# Patient Record
Sex: Female | Born: 2000 | Race: White | Hispanic: No | Marital: Single | Smoking: Never smoker
Health system: Southern US, Community
[De-identification: ages and names within clinical notes are randomized; demographics above are authoritative.]

## PROBLEM LIST (undated history)

## (undated) DIAGNOSIS — F419 Anxiety disorder, unspecified: Secondary | ICD-10-CM

## (undated) DIAGNOSIS — J45909 Unspecified asthma, uncomplicated: Secondary | ICD-10-CM

## (undated) DIAGNOSIS — T7840XA Allergy, unspecified, initial encounter: Secondary | ICD-10-CM

## (undated) HISTORY — DX: Allergy, unspecified, initial encounter: T78.40XA

## (undated) HISTORY — DX: Unspecified asthma, uncomplicated: J45.909

## (undated) HISTORY — DX: Anxiety disorder, unspecified: F41.9

---
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
## 2022-04-03 ENCOUNTER — Ambulatory Visit (INDEPENDENT_AMBULATORY_CARE_PROVIDER_SITE_OTHER): Payer: 59 | Admitting: Family Medicine

## 2022-04-03 ENCOUNTER — Encounter: Payer: Self-pay | Admitting: Family Medicine

## 2022-04-03 ENCOUNTER — Ambulatory Visit
Admission: RE | Admit: 2022-04-03 | Discharge: 2022-04-03 | Disposition: A | Discharge status: Home / Self Care | Payer: 59 | Source: Ambulatory Visit | Attending: Family Medicine | Admitting: Family Medicine

## 2022-04-03 ENCOUNTER — Ambulatory Visit
Admission: RE | Admit: 2022-04-03 | Discharge: 2022-04-03 | Disposition: A | Discharge status: Home / Self Care | Payer: 59 | Attending: Family Medicine | Admitting: Family Medicine

## 2022-04-03 VITALS — BP 102/70 | HR 87 | Ht 68.0 in | Wt 138.0 lb

## 2022-04-03 DIAGNOSIS — M5416 Radiculopathy, lumbar region: Secondary | ICD-10-CM | POA: Diagnosis present

## 2022-04-03 DIAGNOSIS — J45990 Exercise induced bronchospasm: Secondary | ICD-10-CM | POA: Insufficient documentation

## 2022-04-03 MED ORDER — MELOXICAM 7.5 MG PO TABS: 7.5000 mg | ORAL_TABLET | Freq: Every day | ORAL | 0 refills | Status: DC

## 2022-04-03 MED ORDER — METHOCARBAMOL 500 MG PO TABS: 500.0000 mg | ORAL_TABLET | Freq: Three times a day (TID) | ORAL | 1 refills | Status: DC | PRN

## 2022-04-03 MED ORDER — GABAPENTIN 100 MG PO CAPS: 100.0000 mg | ORAL_CAPSULE | Freq: Every day | ORAL | 0 refills | Status: DC

## 2022-04-03 NOTE — Assessment & Plan Note (Signed)
Patient presenting with greater than 1 year of atraumatic low back pain with radiation throughout the left leg below the knee.  She states that at onset she had been increasing her activity in the gym but denies any specific moment of severe pain, has had tightness, pain with prolonged sitting and prolonged standing, radiation down the left leg to below the knee, denies any saddle involvement, no contralateral involvement, no weakness, no bowel/bladder change.  She has seen a chiropractor who obtained x-rays and performed roughly 3 months of physical therapy, all without improvement and describes subjective worsening of symptoms.  Examination today is positive for straight leg raise on the left, contralateral benign, positive Faber on the left, negative FADIR on the left, piriformis stretching equivocal on the left, contralateral benign, sensorimotor intact otherwise bilateral lower extremities, nontender about the spinous processes, paraspinal region, SI joint, piriformis, greater trochanters.  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  Given her report of response to PT despite 29-month duration, will have patient focus on gentle activity as tolerated.  Pending results and response at follow-up we can determine next steps.

## 2022-04-03 NOTE — Progress Notes (Signed)
Primary Care / Sports Medicine Office Visit  Patient Information:  Patient ID: Lisa Atkins, female DOB: [birth year] Age: 21 y.o. MRN: [id number]   Lisa Atkins is a pleasant 21 y.o. female presenting with the following:  Chief Complaint  Patient presents with   Back Pain    Pain left lower 1 year, does radiate for buttocks. Feels like herniated disk. Went to pt was told to come here and get MRI. Has had xrays.     Vitals:   04/03/22 0907  BP: 102/70  Pulse: 87  SpO2: 97%   Vitals:   04/03/22 0907  Weight: 138 lb (62.6 kg)  Height: 5\' 8"  (1.727 m)   Body mass index is 20.98 kg/m.  No results found.   Independent interpretation of notes and tests performed by another provider:   None  Procedures performed:   None  Pertinent History, Exam, Impression, and Recommendations:   Problem List Items Addressed This Visit       Nervous and Auditory   Lumbar back pain with radiculopathy affecting left lower extremity - Primary    Patient presenting with greater than 1 year of atraumatic low back pain with radiation throughout the left leg below the knee.  She states that at onset she had been increasing her activity in the gym but denies any specific moment of severe pain, has had tightness, pain with prolonged sitting and prolonged standing, radiation down the left leg to below the knee, denies any saddle involvement, no contralateral involvement, no weakness, no bowel/bladder change.  She has seen a chiropractor who obtained x-rays and performed roughly 3 months of physical therapy, all without improvement and describes subjective worsening of symptoms.  Examination today is positive for straight leg raise on the left, contralateral benign, positive Faber on the left, negative FADIR on the left, piriformis stretching equivocal on the left, contralateral benign, sensorimotor intact otherwise bilateral lower extremities, nontender about the spinous processes, paraspinal  region, SI joint, piriformis, greater trochanters.  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  Given her report of response to PT despite 36-month duration, will have patient focus on gentle activity as tolerated.  Pending results and response at follow-up we can determine next steps.      Relevant Medications   meloxicam (MOBIC) 7.5 MG tablet   gabapentin (NEURONTIN) 100 MG capsule   methocarbamol (ROBAXIN) 500 MG tablet   Other Relevant Orders   DG Lumbar Spine Complete   MR Lumbar Spine Wo Contrast     Orders & Medications Meds ordered this encounter  Medications   meloxicam (MOBIC) 7.5 MG tablet    Sig: Take 1 tablet (7.5 mg total) by mouth daily.    Dispense:  45 tablet    Refill:  0   gabapentin (NEURONTIN) 100 MG capsule    Sig: Take 1 capsule (100 mg total) by mouth at bedtime.    Dispense:  45 capsule    Refill:  0   methocarbamol (ROBAXIN) 500 MG tablet    Sig: Take 1 tablet (500 mg total) by mouth every 8 (eight) hours as needed for muscle spasms.    Dispense:  30 tablet    Refill:  1  Orders Placed This Encounter  Procedures   DG Lumbar Spine Complete   MR Lumbar Spine Wo Contrast     Return in about 6 weeks (around 05/15/2022).     Jerrol Banana, MD   Primary Care Sports Medicine [facility]

## 2022-04-03 NOTE — Patient Instructions (Signed)
-   Obtain x-rays today - Start meloxicam once daily with food (no other NSAIDs while on this medication) - Start gabapentin nightly - Can take methocarbamol (muscle relaxer) every 8 hours as needed for muscle tightness pain, side effect can be drowsiness - Coordinator will contact you to schedule MRI - Perform activity as tolerated using low back/leg symptoms as a guide - Contact us for any questions - Return for follow-up in 6 weeks

## 2022-04-11 ENCOUNTER — Ambulatory Visit
Admission: RE | Admit: 2022-04-11 | Discharge: 2022-04-11 | Disposition: A | Discharge status: Home / Self Care | Payer: 59 | Source: Ambulatory Visit | Attending: Family Medicine | Admitting: Family Medicine

## 2022-04-11 DIAGNOSIS — M5416 Radiculopathy, lumbar region: Secondary | ICD-10-CM | POA: Insufficient documentation

## 2022-04-13 ENCOUNTER — Other Ambulatory Visit: Payer: Self-pay

## 2022-04-13 ENCOUNTER — Encounter: Payer: Self-pay | Admitting: Family Medicine

## 2022-04-13 DIAGNOSIS — M5416 Radiculopathy, lumbar region: Secondary | ICD-10-CM

## 2022-04-13 NOTE — Telephone Encounter (Signed)
Referral place 

## 2022-04-13 NOTE — Telephone Encounter (Signed)
Please advise 

## 2022-05-03 ENCOUNTER — Encounter: Payer: Self-pay | Admitting: Pain Medicine

## 2022-05-15 ENCOUNTER — Ambulatory Visit: Payer: 59 | Admitting: Family Medicine

## 2022-05-15 ENCOUNTER — Ambulatory Visit: Payer: Self-pay | Admitting: Family Medicine

## 2022-06-10 ENCOUNTER — Encounter: Payer: Self-pay | Admitting: Pain Medicine

## 2022-06-10 DIAGNOSIS — Z789 Other specified health status: Secondary | ICD-10-CM | POA: Insufficient documentation

## 2022-06-10 DIAGNOSIS — M899 Disorder of bone, unspecified: Secondary | ICD-10-CM | POA: Insufficient documentation

## 2022-06-10 DIAGNOSIS — G894 Chronic pain syndrome: Secondary | ICD-10-CM | POA: Insufficient documentation

## 2022-06-10 DIAGNOSIS — Z79899 Other long term (current) drug therapy: Secondary | ICD-10-CM

## 2022-06-10 DIAGNOSIS — R937 Abnormal findings on diagnostic imaging of other parts of musculoskeletal system: Secondary | ICD-10-CM | POA: Insufficient documentation

## 2022-06-10 HISTORY — DX: Other long term (current) drug therapy: Z79.899

## 2022-06-10 NOTE — Progress Notes (Unsigned)
Patient: Lisa Atkins  Service Category: E/M  Provider: Gaspar Cola, MD  DOB: Aug 20, 2000  DOS: 06/11/2022  Referring Provider: Montel Culver, MD  MRN: [id number]  Setting: Ambulatory outpatient  PCP: Tresea Mall, MD  Type: New Patient  Specialty: Interventional Pain Management    Location: Office  Delivery: Face-to-face     Primary Reason(s) for Visit: Encounter for initial evaluation of one or more chronic problems (new to examiner) potentially causing chronic pain, and posing a threat to normal musculoskeletal function. (Level of risk: High) CC: No chief complaint on file.  HPI  Lisa Atkins is a 22 y.o. year old, female patient, who comes for the first time to our practice referred by Montel Culver, MD for our initial evaluation of her chronic pain. She has Exercise-induced asthma; Lumbar back pain with radiculopathy affecting left lower extremity; Chronic pain syndrome; Pharmacologic therapy; Disorder of skeletal system; Problems influencing health status; and Abnormal MRI, lumbar spine (04/11/2022) on their problem list. Today she comes in for evaluation of her No chief complaint on file.  Pain Assessment: Location:     Radiating:   Onset:   Duration:   Quality:   Severity:  /10 (subjective, self-reported pain score)  Effect on ADL:   Timing:   Modifying factors:   BP:    HR:    Onset and Duration: {Hx; Onset and Duration:210120511} Cause of pain: {Hx; Cause:210120521} Severity: {Pain Severity:210120502} Timing: {Symptoms; Timing:210120501} Aggravating Factors: {Causes; Aggravating pain factors:210120507} Alleviating Factors: {Causes; Alleviating Factors:210120500} Associated Problems: {Hx; Associated problems:210120515} Quality of Pain: {Hx; Symptom quality or Descriptor:210120531} Previous Examinations or Tests: {Hx; Previous examinations or test:210120529} Previous Treatments: {Hx; Previous Treatment:210120503}  Lisa Atkins is being evaluated for possible  interventional pain management therapies for the treatment of her chronic pain.   ***  Lisa Atkins has been informed that this initial visit was an evaluation only.  On the follow up appointment I will go over the results, including ordered tests and available interventional therapies. At that time she will have the opportunity to decide whether to proceed with offered therapies or not. In the event that Ms. Clinkenbeard prefers avoiding interventional options, this will conclude our involvement in the case.  Medication management recommendations may be provided upon request.  Historic Controlled Substance Pharmacotherapy Review  PMP and historical list of controlled substances: ***  Most recently prescribed opioid analgesics:   *** MME/day: *** mg/day  Historical Monitoring: The patient  reports that she does not currently use drugs. List of prior UDS Testing: No results found for: "MDMA", "COCAINSCRNUR", "PCPSCRNUR", "PCPQUANT", "CANNABQUANT", "THCU", "ETH", "CBDTHCR", "D8THCCBX", "D9THCCBX" Historical Background Evaluation: [place] PMP: PDMP reviewed during this encounter. Review of the past 87-months conducted.             PMP NARX Score Report:  Narcotic: 000 Sedative: 000 Stimulant: 000 [place] Department of public safety, offender search: Editor, commissioning Information) Non-contributory Risk Assessment Profile: Aberrant behavior: None observed or detected today Risk factors for fatal opioid overdose: None identified today PMP NARX Overdose Risk Score: 000 Fatal overdose hazard ratio (HR): Calculation deferred Non-fatal overdose hazard ratio (HR): Calculation deferred Risk of opioid abuse or dependence: 0.7-3.0% with doses ? 36 MME/day and 6.1-26% with doses ? 120 MME/day. Substance use disorder (SUD) risk level: See below Personal History of Substance Abuse (SUD-Substance use disorder):  Alcohol:    Illegal Drugs:    Rx Drugs:    ORT Risk Level calculation:    ORT Scoring interpretation table:  Score <3 = Low Risk for SUD  Score between 4-7 = Moderate Risk for SUD  Score >8 = High Risk for Opioid Abuse   PHQ-2 Depression Scale:  Total score:    PHQ-2 Scoring interpretation table: (Score and probability of major depressive disorder)  Score 0 = No depression  Score 1 = 15.4% Probability  Score 2 = 21.1% Probability  Score 3 = 38.4% Probability  Score 4 = 45.5% Probability  Score 5 = 56.4% Probability  Score 6 = 78.6% Probability   PHQ-9 Depression Scale:  Total score:    PHQ-9 Scoring interpretation table:  Score 0-4 = No depression  Score 5-9 = Mild depression  Score 10-14 = Moderate depression  Score 15-19 = Moderately severe depression  Score 20-27 = Severe depression (2.4 times higher risk of SUD and 2.89 times higher risk of overuse)   Pharmacologic Plan: As per protocol, I have not taken over any controlled substance management, pending the results of ordered tests and/or consults.            Initial impression: Pending review of available data and ordered tests.  Meds   Current Outpatient Medications:    FLOVENT HFA 44 MCG/ACT inhaler, SMARTSIG:2 Puff(s) By Mouth Twice Daily, Disp: , Rfl:    gabapentin (NEURONTIN) 100 MG capsule, Take 1 capsule (100 mg total) by mouth at bedtime., Disp: 45 capsule, Rfl: 0   meloxicam (MOBIC) 7.5 MG tablet, Take 1 tablet (7.5 mg total) by mouth daily., Disp: 45 tablet, Rfl: 0   methocarbamol (ROBAXIN) 500 MG tablet, Take 1 tablet (500 mg total) by mouth every 8 (eight) hours as needed for muscle spasms., Disp: 30 tablet, Rfl: 1  Imaging Review  Lumbosacral Imaging: Lumbar MR wo contrast: Results for orders placed during the hospital encounter of 04/11/22 MR Lumbar Spine Wo Contrast  Narrative CLINICAL DATA:  Lumbar radiculopathy. Symptoms greater than 6 weeks. Lower back pain  EXAM: MRI LUMBAR SPINE WITHOUT CONTRAST  TECHNIQUE: Multiplanar, multisequence MR imaging of the lumbar spine was performed. No intravenous  contrast was administered.  COMPARISON:  None Available.  FINDINGS: Segmentation:   5 non rib-bearing lumbar type vertebral bodies are present. The lowest fully formed vertebral body is L5.  Alignment:  Physiologic.  Vertebrae:  No fracture, evidence of discitis, or bone lesion.  Conus medullaris and cauda equina: Conus extends to the L1 level. Conus and cauda equina appear normal.  Paraspinal and other soft tissues: Negative.  Disc levels:  T12-L1: No significant disc bulge. No neural foraminal stenosis. No central canal stenosis.  L1-L2: No significant disc bulge. No neural foraminal stenosis. No central canal stenosis.  L2-L3: No significant disc bulge. No neural foraminal stenosis. No central canal stenosis.  L3-L4: No significant disc bulge. No neural foraminal stenosis. No central canal stenosis.  L4-L5: No significant disc bulge. No neural foraminal stenosis. No central canal stenosis.  L5-S1: Eccentric disc extrusion to the left with marked narrowing of the left lateral recess and impingement of the descending nerve roots. Mild left neural foraminal stenosis. Mild bilateral facet joint arthropathy.  IMPRESSION: Eccentric disc extrusion to the left at L5-S1 with marked narrowing of the left lateral recess and impingement of the descending nerve roots. Mild left neural foraminal stenosis.   Electronically Signed By: Keane Police D.O. On: 04/11/2022 17:00  Lumbar DG (Complete) 4+V: Results for orders placed during the hospital encounter of 04/03/22 DG Lumbar Spine Complete  Narrative CLINICAL DATA:  Low back pain with left radicular  symptoms  EXAM: LUMBAR SPINE - COMPLETE 4+ VIEW  COMPARISON:  None Available.  FINDINGS: There is no evidence of lumbar spine fracture. Alignment is normal. Intervertebral disc spaces are maintained.  IMPRESSION: Negative.   Electronically Signed By: Judie Petit.  Shick M.D. On: 04/03/2022 13:46  Elbow Imaging: Elbow-L  DG Complete: Results for orders placed during the hospital encounter of 10/07/15 DG Elbow Complete Left  Narrative CLINICAL DATA:  Pain after fall.  EXAM: LEFT ELBOW - COMPLETE 3+ VIEW  COMPARISON:  None.  FINDINGS: The anterior fat pad is slightly prominent but no posterior fat pad is identified. No fractures are identified.  IMPRESSION: No fractures identified. The anterior fat pad is slightly prominent but there is no posterior fat pad identified. No convincing evidence of effusion.   Electronically Signed By: Gerome Sam III M.D On: 10/07/2015 14:35  Complexity Note: Imaging results reviewed.                         ROS  Cardiovascular: {Hx; Cardiovascular History:210120525} Pulmonary or Respiratory: {Hx; Pumonary and/or Respiratory History:210120523} Neurological: {Hx; Neurological:210120504} Psychological-Psychiatric: {Hx; Psychological-Psychiatric History:210120512} Gastrointestinal: {Hx; Gastrointestinal:210120527} Genitourinary: {Hx; Genitourinary:210120506} Hematological: {Hx; Hematological:210120510} Endocrine: {Hx; Endocrine history:210120509} Rheumatologic: {Hx; Rheumatological:210120530} Musculoskeletal: {Hx; Musculoskeletal:210120528} Work History: {Hx; Work history:210120514}  Allergies  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  Laboratory Chemistry Profile   Renal No results found for: "BUN", "CREATININE", "LABCREA", "BCR", "GFR", "GFRAA", "GFRNONAA", "SPECGRAV", "PHUR", "PROTEINUR"   Electrolytes No results found for: "NA", "K", "CL", "CALCIUM", "MG", "PHOS"   Hepatic No results found for: "AST", "ALT", "ALBUMIN", "ALKPHOS", "AMYLASE", "LIPASE", "AMMONIA"   ID No results found for: "LYMEIGGIGMAB", "HIV", "SARSCOV2NAA", "STAPHAUREUS", "MRSAPCR", "HCVAB", "PREGTESTUR", "RMSFIGG", "QFVRPH1IGG", "QFVRPH2IGG"   Bone No results found for: "VD25OH", "VD125OH2TOT", "KG4010UV2",  "ZD6644IH4", "25OHVITD1", "25OHVITD2", "25OHVITD3", "TESTOFREE", "TESTOSTERONE"   Endocrine No results found for: "GLUCOSE", "GLUCOSEU", "HGBA1C", "TSH", "FREET4", "TESTOFREE", "TESTOSTERONE", "SHBG", "ESTRADIOL", "ESTRADIOLPCT", "ESTRADIOLFRE", "LABPREG", "ACTH", "CRTSLPL", "UCORFRPERLTR", "UCORFRPERDAY", "CORTISOLBASE"   Neuropathy No results found for: "VITAMINB12", "FOLATE", "HGBA1C", "HIV"   CNS No results found for: "COLORCSF", "APPEARCSF", "RBCCOUNTCSF", "WBCCSF", "POLYSCSF", "LYMPHSCSF", "EOSCSF", "PROTEINCSF", "GLUCCSF", "JCVIRUS", "CSFOLI", "IGGCSF", "LABACHR", "ACETBL"   Inflammation (CRP: Acute  ESR: Chronic) No results found for: "CRP", "ESRSEDRATE", "LATICACIDVEN"   Rheumatology No results found for: "RF", "ANA", "LABURIC", "URICUR", "LYMEIGGIGMAB", "LYMEABIGMQN", "HLAB27"   Coagulation No results found for: "INR", "LABPROT", "APTT", "PLT", "DDIMER", "LABHEMA", "VITAMINK1", "AT3"   Cardiovascular No results found for: "BNP", "CKTOTAL", "CKMB", "TROPONINI", "HGB", "HCT", "LABVMA", "EPIRU", "EPINEPH24HUR", "NOREPRU", "NOREPI24HUR", "DOPARU", "DOPAM24HRUR"   Screening No results found for: "SARSCOV2NAA", "COVIDSOURCE", "STAPHAUREUS", "MRSAPCR", "HCVAB", "HIV", "PREGTESTUR"   Cancer No results found for: "CEA", "CA125", "LABCA2"   Allergens No results found for: "ALMOND", "APPLE", "ASPARAGUS", "AVOCADO", "BANANA", "BARLEY", "BASIL", "BAYLEAF", "GREENBEAN", "LIMABEAN", "WHITEBEAN", "BEEFIGE", "REDBEET", "BLUEBERRY", "BROCCOLI", "CABBAGE", "MELON", "CARROT", "CASEIN", "CASHEWNUT", "CAULIFLOWER", "CELERY"     Note: Lab results reviewed.  PFSH  Drug: Ms. Fossett  reports that she does not currently use drugs. Alcohol:  reports no history of alcohol use. Tobacco:  reports that she has never smoked. She has never used smokeless tobacco. Medical:  has a past medical history of Allergy, Anxiety, Asthma, and Pharmacologic therapy (06/10/2022). Family: family history is not on  file.  No past surgical history on file. Active Ambulatory Problems    Diagnosis Date Noted   Exercise-induced asthma 04/03/2022   Lumbar back pain with radiculopathy affecting left lower extremity 04/03/2022   Chronic pain syndrome  06/10/2022   Pharmacologic therapy 06/10/2022   Disorder of skeletal system 06/10/2022   Problems influencing health status 06/10/2022   Abnormal MRI, lumbar spine (04/11/2022) 06/10/2022   Resolved Ambulatory Problems    Diagnosis Date Noted   No Resolved Ambulatory Problems   Past Medical History:  Diagnosis Date   Allergy    Anxiety    Asthma    Constitutional Exam  General appearance: Well nourished, well developed, and well hydrated. In no apparent acute distress There were no vitals filed for this visit. BMI Assessment: Estimated body mass index is 20.98 kg/m as calculated from the following:   Height as of 04/03/22: 5\' 8"  (1.727 m).   Weight as of 04/03/22: 138 lb (62.6 kg).  BMI interpretation table: BMI level Category Range association with higher incidence of chronic pain  <18 kg/m2 Underweight   18.5-24.9 kg/m2 Ideal body weight   25-29.9 kg/m2 Overweight Increased incidence by 20%  30-34.9 kg/m2 Obese (Class I) Increased incidence by 68%  35-39.9 kg/m2 Severe obesity (Class II) Increased incidence by 136%  >40 kg/m2 Extreme obesity (Class III) Increased incidence by 254%   Patient's current BMI Ideal Body weight  There is no height or weight on file to calculate BMI. Patient weight not recorded   BMI Readings from Last 4 Encounters:  04/03/22 20.98 kg/m (42 %, Z= -0.20)*   * Growth percentiles are based on CDC (Girls, 2-20 Years) data.   Wt Readings from Last 4 Encounters:  04/03/22 138 lb (62.6 kg) (68 %, Z= 0.47)*   * Growth percentiles are based on CDC (Girls, 2-20 Years) data.    Psych/Mental status: Alert, oriented x 3 (person, place, & time)       Eyes: PERLA Respiratory: No evidence of acute respiratory  distress  Assessment  Primary Diagnosis & Pertinent Problem List: The primary encounter diagnosis was Chronic pain syndrome. Diagnoses of Pharmacologic therapy, Disorder of skeletal system, and Problems influencing health status were also pertinent to this visit.  Visit Diagnosis (New problems to examiner): 1. Chronic pain syndrome   2. Pharmacologic therapy   3. Disorder of skeletal system   4. Problems influencing health status    Plan of Care (Initial workup plan)  Note: Ms. Peragine was reminded that as per protocol, today's visit has been an evaluation only. We have not taken over the patient's controlled substance management.  Problem-specific plan: No problem-specific Assessment & Plan notes found for this encounter.  Lab Orders  No laboratory test(s) ordered today   Imaging Orders  No imaging studies ordered today   Referral Orders  No referral(s) requested today   Procedure Orders    No procedure(s) ordered today   Pharmacotherapy (current): Medications ordered:  No orders of the defined types were placed in this encounter.  Medications administered during this visit: Tavonna D. Quentin Ore had no medications administered during this visit.   Analgesic Pharmacotherapy:  Opioid Analgesics: For patients currently taking or requesting to take opioid analgesics, in accordance with [place], we will assess their risks and indications for the use of these substances. After completing our evaluation, we may offer recommendations, but we no longer take patients for medication management. The prescribing physician will ultimately decide, based on his/her training and level of comfort whether to adopt any of the recommendations, including whether or not to prescribe such medicines.  Membrane stabilizer: To be determined at a later time  Muscle relaxant: To be determined at a later time  NSAID:  To be determined at a later time  Other analgesic(s): To be  determined at a later time   Interventional management options: Ms. Steinert was informed that there is no guarantee that she would be a candidate for interventional therapies. The decision will be based on the results of diagnostic studies, as well as Ms. Herren's risk profile.  Procedure(s) under consideration:  Pending results of ordered studies      Interventional Therapies  Risk Factors  Considerations:     Planned  Pending:   See above for possible orders   Under consideration:   Pending completion of evaluation   Completed:   None at this time   Completed by other providers:   None at this time   Therapeutic  Palliative (PRN) options:   None established      Provider-requested follow-up: No follow-ups on file.  Future Appointments  Date Time Provider [place]  06/11/2022  1:00 PM Milinda Pointer, MD [facility] None    Duration of encounter: *** minutes.  Total time on encounter, as per AMA guidelines included both the face-to-face and non-face-to-face time personally spent by the physician and/or other qualified health care professional(s) on the day of the encounter (includes time in activities that require the physician or other qualified health care professional and does not include time in activities normally performed by clinical staff). Physician's time may include the following activities when performed: Preparing to see the patient (e.g., pre-charting review of records, searching for previously ordered imaging, lab work, and nerve conduction tests) Review of prior analgesic pharmacotherapies. Reviewing PMP Interpreting ordered tests (e.g., lab work, imaging, nerve conduction tests) Performing post-procedure evaluations, including interpretation of diagnostic procedures Obtaining and/or reviewing separately obtained history Performing a medically appropriate examination and/or evaluation Counseling and educating the  patient/family/caregiver Ordering medications, tests, or procedures Referring and communicating with other health care professionals (when not separately reported) Documenting clinical information in the electronic or other health record Independently interpreting results (not separately reported) and communicating results to the patient/ family/caregiver Care coordination (not separately reported)  Note by: Gaspar Cola, MD Date: 06/11/2022; Time: 11:06 AM

## 2022-06-11 ENCOUNTER — Encounter: Payer: Self-pay | Admitting: Pain Medicine

## 2022-06-11 ENCOUNTER — Ambulatory Visit: Payer: 59 | Attending: Pain Medicine | Admitting: Pain Medicine

## 2022-06-11 VITALS — BP 118/79 | HR 82 | Temp 97.2°F | Resp 16 | Ht 68.0 in | Wt 129.0 lb

## 2022-06-11 DIAGNOSIS — M4807 Spinal stenosis, lumbosacral region: Secondary | ICD-10-CM | POA: Insufficient documentation

## 2022-06-11 DIAGNOSIS — M79605 Pain in left leg: Secondary | ICD-10-CM | POA: Diagnosis present

## 2022-06-11 DIAGNOSIS — M5416 Radiculopathy, lumbar region: Secondary | ICD-10-CM | POA: Diagnosis present

## 2022-06-11 DIAGNOSIS — R937 Abnormal findings on diagnostic imaging of other parts of musculoskeletal system: Secondary | ICD-10-CM | POA: Diagnosis present

## 2022-06-11 DIAGNOSIS — M5127 Other intervertebral disc displacement, lumbosacral region: Secondary | ICD-10-CM | POA: Insufficient documentation

## 2022-06-11 DIAGNOSIS — M899 Disorder of bone, unspecified: Secondary | ICD-10-CM

## 2022-06-11 DIAGNOSIS — M5410 Radiculopathy, site unspecified: Secondary | ICD-10-CM | POA: Insufficient documentation

## 2022-06-11 DIAGNOSIS — M5442 Lumbago with sciatica, left side: Secondary | ICD-10-CM | POA: Diagnosis present

## 2022-06-11 DIAGNOSIS — M5417 Radiculopathy, lumbosacral region: Secondary | ICD-10-CM | POA: Insufficient documentation

## 2022-06-11 DIAGNOSIS — Z789 Other specified health status: Secondary | ICD-10-CM

## 2022-06-11 DIAGNOSIS — Z79899 Other long term (current) drug therapy: Secondary | ICD-10-CM

## 2022-06-11 DIAGNOSIS — G8929 Other chronic pain: Secondary | ICD-10-CM | POA: Insufficient documentation

## 2022-06-11 DIAGNOSIS — G894 Chronic pain syndrome: Secondary | ICD-10-CM | POA: Diagnosis present

## 2022-06-11 MED ORDER — PREDNISONE 20 MG PO TABS: ORAL_TABLET | ORAL | 0 refills | Status: AC

## 2022-06-11 NOTE — Patient Instructions (Signed)
______________________________________________________________________  Procedure instructions  Do not eat or drink fluids (other than water) for 6 hours before your procedure  No water for 2 hours before your procedure  Take your blood pressure medicine with a sip of water  Arrive 30 minutes before your appointment  Carefully read the "Preparing for your procedure" detailed instructions  If you have questions call us at [phone]  _____________________________________________________________________    ______________________________________________________________________  Preparing for your procedure  During your procedure appointment there will be: No Prescription Refills. No disability issues to discussed. No medication changes or discussions.  Instructions: Food intake: Avoid eating anything solid for at least 8 hours prior to your procedure. Clear liquid intake: You may take clear liquids such as water up to 2 hours prior to your procedure. (No carbonated drinks. No soda.) Transportation: Unless otherwise stated by your physician, bring a driver. Morning Medicines: Except for blood thinners, take all of your other morning medications with a sip of water. Make sure to take your heart and blood pressure medicines. If your blood pressure's lower number is above 100, the case will be rescheduled. Blood thinners: Make sure to stop your blood thinners as instructed.  If you take a blood thinner, but were not instructed to stop it, call our office [phone] and ask to talk to a nurse. Not stopping a blood thinner prior to certain procedures could lead to serious complications. Diabetics on insulin: Notify the staff so that you can be scheduled 1st case in the morning. If your diabetes requires high dose insulin, take only  of your normal insulin dose the morning of the procedure and notify the staff that you have done so. Preventing infections: Shower with an  antibacterial soap the morning of your procedure.  Build-up your immune system: Take 1000 mg of Vitamin C with every meal (3 times a day) the day prior to your procedure. Antibiotics: Inform the nursing staff if you are taking any antibiotics or if you have any conditions that may require antibiotics prior to procedures. (Example: recent joint implants)   Pregnancy: If you are pregnant make sure to notify the nursing staff. Not doing so may result in injury to the fetus, including death.  Sickness: If you have a cold, fever, or any active infections, call and cancel or reschedule your procedure. Receiving steroids while having an infection may result in complications. Arrival: You must be in the facility at least 30 minutes prior to your scheduled procedure. Tardiness: Your scheduled time is also the cutoff time. If you do not arrive at least 15 minutes prior to your procedure, you will be rescheduled.  Children: Do not bring any children with you. Make arrangements to keep them home. Dress appropriately: There is always a possibility that your clothing may get soiled. Avoid long dresses. Valuables: Do not bring any jewelry or valuables.  Reasons to call and reschedule or cancel your procedure: (Following these recommendations will minimize the risk of a serious complication.) Surgeries: Avoid having procedures within 2 weeks of any surgery. (Avoid for 2 weeks before or after any surgery). Flu Shots: Avoid having procedures within 2 weeks of a flu shots or . (Avoid for 2 weeks before or after immunizations). Barium: Avoid having a procedure within 7-10 days after having had a radiological study involving the use of radiological contrast. (Myelograms, Barium swallow or enema study). Heart attacks: Avoid any elective procedures or surgeries for the initial 6 months after a "Myocardial Infarction" (Heart Attack). Blood thinners: It  is imperative that you stop these medications before procedures. Let us  know if you if you take any blood thinner.  Infection: Avoid procedures during or within two weeks of an infection (including chest colds or gastrointestinal problems). Symptoms associated with infections include: Localized redness, fever, chills, night sweats or profuse sweating, burning sensation when voiding, cough, congestion, stuffiness, runny nose, sore throat, diarrhea, nausea, vomiting, cold or Flu symptoms, recent or current infections. It is specially important if the infection is over the area that we intend to treat. Heart and lung problems: Symptoms that may suggest an active cardiopulmonary problem include: cough, chest pain, breathing difficulties or shortness of breath, dizziness, ankle swelling, uncontrolled high or unusually low blood pressure, and/or palpitations. If you are experiencing any of these symptoms, cancel your procedure and contact your primary care physician for an evaluation.  Remember:  Regular Business hours are:  Monday to Thursday 8:00 AM to 4:00 PM  Provider's Schedule: Milinda Pointer, MD:  Procedure days: Tuesday and Thursday 7:30 AM to 4:00 PM  Gillis Santa, MD:  Procedure days: Monday and Wednesday 7:30 AM to 4:00 PM  ______________________________________________________________________    ____________________________________________________________________________________________  General Risks and Possible Complications  Patient Responsibilities: It is important that you read this as it is part of your informed consent. It is our duty to inform you of the risks and possible complications associated with treatments offered to you. It is your responsibility as a patient to read this and to ask questions about anything that is not clear or that you believe was not covered in this document.  Patient's Rights: You have the right to refuse treatment. You also have the right to change your mind, even after initially having agreed to have the treatment  done. However, under this last option, if you wait until the last second to change your mind, you may be charged for the materials used up to that point.  Introduction: Medicine is not an Chief Strategy Officer. Everything in Medicine, including the lack of treatment(s), carries the potential for danger, harm, or loss (which is by definition: Risk). In Medicine, a complication is a secondary problem, condition, or disease that can aggravate an already existing one. All treatments carry the risk of possible complications. The fact that a side effects or complications occurs, does not imply that the treatment was conducted incorrectly. It must be clearly understood that these can happen even when everything is done following the highest safety standards.  No treatment: You can choose not to proceed with the proposed treatment alternative. The "PRO(s)" would include: avoiding the risk of complications associated with the therapy. The "CON(s)" would include: not getting any of the treatment benefits. These benefits fall under one of three categories: diagnostic; therapeutic; and/or palliative. Diagnostic benefits include: getting information which can ultimately lead to improvement of the disease or symptom(s). Therapeutic benefits are those associated with the successful treatment of the disease. Finally, palliative benefits are those related to the decrease of the primary symptoms, without necessarily curing the condition (example: decreasing the pain from a flare-up of a chronic condition, such as incurable terminal cancer).  General Risks and Complications: These are associated to most interventional treatments. They can occur alone, or in combination. They fall under one of the following six (6) categories: no benefit or worsening of symptoms; bleeding; infection; nerve damage; allergic reactions; and/or death. No benefits or worsening of symptoms: In Medicine there are no guarantees, only probabilities. No  healthcare provider can ever guarantee that a  medical treatment will work, they can only state the probability that it may. Furthermore, there is always the possibility that the condition may worsen, either directly, or indirectly, as a consequence of the treatment. Bleeding: This is more common if the patient is taking a blood thinner, either prescription or over the counter (example: Goody Powders, Fish oil, Aspirin, Garlic, etc.), or if suffering a condition associated with impaired coagulation (example: Hemophilia, cirrhosis of the liver, low platelet counts, etc.). However, even if you do not have one on these, it can still happen. If you have any of these conditions, or take one of these drugs, make sure to notify your treating physician. Infection: This is more common in patients with a compromised immune system, either due to disease (example: diabetes, cancer, human immunodeficiency virus [HIV], etc.), or due to medications or treatments (example: therapies used to treat cancer and rheumatological diseases). However, even if you do not have one on these, it can still happen. If you have any of these conditions, or take one of these drugs, make sure to notify your treating physician. Nerve Damage: This is more common when the treatment is an invasive one, but it can also happen with the use of medications, such as those used in the treatment of cancer. The damage can occur to small secondary nerves, or to large primary ones, such as those in the spinal cord and brain. This damage may be temporary or permanent and it may lead to impairments that can range from temporary numbness to permanent paralysis and/or brain death. Allergic Reactions: Any time a substance or material comes in contact with our body, there is the possibility of an allergic reaction. These can range from a mild skin rash (contact dermatitis) to a severe systemic reaction (anaphylactic reaction), which can result in death. Death: In  general, any medical intervention can result in death, most of the time due to an unforeseen complication. ____________________________________________________________________________________________    ____________________________________________________________________________________________  Blood Thinners  IMPORTANT NOTICE:  If you take any of these, make sure to notify the nursing staff.  Failure to do so may result in injury.  Recommended time intervals to stop and restart blood-thinners, before & after invasive procedures  Generic Name Brand Name Pre-procedure. Stop this long before procedure. Post-procedure. Minimum waiting period before restarting.  Abciximab Reopro 15 days 2 hrs  Alteplase Activase 10 days 10 days  Anagrelide Agrylin    Apixaban Eliquis 3 days 6 hrs  Cilostazol Pletal 3 days 5 hrs  Clopidogrel Plavix 7-10 days 2 hrs  Dabigatran Pradaxa 5 days 6 hrs  Dalteparin Fragmin 24 hours 4 hrs  Dipyridamole Aggrenox 11days 2 hrs  Edoxaban Lixiana; Savaysa 3 days 2 hrs  Enoxaparin  Lovenox 24 hours 4 hrs  Eptifibatide Integrillin 8 hours 2 hrs  Fondaparinux  Arixtra 72 hours 12 hrs  Hydroxychloroquine Plaquenil 11 days   Prasugrel Effient 7-10 days 6 hrs  Reteplase Retavase 10 days 10 days  Rivaroxaban Xarelto 3 days 6 hrs  Ticagrelor Brilinta 5-7 days 6 hrs  Ticlopidine Ticlid 10-14 days 2 hrs  Tinzaparin Innohep 24 hours 4 hrs  Tirofiban Aggrastat 8 hours 2 hrs  Warfarin Coumadin 5 days 2 hrs   Other medications with blood-thinning effects  Product indications Generic (Brand) names Note  Cholesterol Lipitor Stop 4 days before procedure  Blood thinner (injectable) Heparin (LMW or LMWH Heparin) Stop 24 hours before procedure  Cancer Ibrutinib (Imbruvica) Stop 7 days before procedure  Malaria/Rheumatoid Hydroxychloroquine (Plaquenil) Stop 11 days  before procedure  Thrombolytics  10 days before or after procedures   Over-the-counter (OTC) Products with  blood-thinning effects  Product Common names Stop Time  Aspirin > 325 mg Goody Powders, Excedrin, etc. 11 days  Aspirin ? 81 mg  7 days  Fish oil  4 days  Garlic supplements  7 days  Ginkgo biloba  36 hours  Ginseng  24 hours  NSAIDs Ibuprofen, Naprosyn, etc. 3 days  Vitamin E  4 days   ____________________________________________________________________________________________

## 2022-06-11 NOTE — Progress Notes (Signed)
Safety precautions to be maintained throughout the outpatient stay will include: orient to surroundings, keep bed in low position, maintain call bell within reach at all times, provide assistance with transfer out of bed and ambulation.  

## 2022-07-04 ENCOUNTER — Other Ambulatory Visit: Payer: Self-pay | Admitting: Pain Medicine

## 2022-07-04 ENCOUNTER — Telehealth: Payer: Self-pay

## 2022-07-04 DIAGNOSIS — M5127 Other intervertebral disc displacement, lumbosacral region: Secondary | ICD-10-CM

## 2022-07-04 DIAGNOSIS — M5416 Radiculopathy, lumbar region: Secondary | ICD-10-CM

## 2022-07-04 DIAGNOSIS — R937 Abnormal findings on diagnostic imaging of other parts of musculoskeletal system: Secondary | ICD-10-CM

## 2022-07-04 DIAGNOSIS — M4807 Spinal stenosis, lumbosacral region: Secondary | ICD-10-CM

## 2022-07-04 NOTE — Telephone Encounter (Signed)
Pre-procedure instructions given to patient.

## 2022-07-04 NOTE — Telephone Encounter (Signed)
March 7 at  1040 Patient is aware and expecting your call.

## 2022-07-04 NOTE — Telephone Encounter (Signed)
She called in to say the steroid taper didn't help. She wants to proceed with the [facility] DR. Consuela Mimes has a PRN order. Does she need an eval or can I schedule Lesi? His note says if taper doesn't work we will bring her in for [facility].

## 2022-07-12 ENCOUNTER — Encounter: Payer: Self-pay | Admitting: Pain Medicine

## 2022-07-12 ENCOUNTER — Ambulatory Visit
Admission: RE | Admit: 2022-07-12 | Discharge: 2022-07-12 | Disposition: A | Discharge status: Home / Self Care | Payer: 59 | Source: Ambulatory Visit | Attending: Pain Medicine | Admitting: Pain Medicine

## 2022-07-12 ENCOUNTER — Ambulatory Visit: Payer: 59 | Attending: Pain Medicine | Admitting: Pain Medicine

## 2022-07-12 VITALS — BP 129/83 | HR 79 | Temp 79.5°F | Resp 19 | Ht 68.0 in | Wt 126.0 lb

## 2022-07-12 DIAGNOSIS — M5417 Radiculopathy, lumbosacral region: Secondary | ICD-10-CM

## 2022-07-12 DIAGNOSIS — M5442 Lumbago with sciatica, left side: Secondary | ICD-10-CM | POA: Diagnosis not present

## 2022-07-12 DIAGNOSIS — M4807 Spinal stenosis, lumbosacral region: Secondary | ICD-10-CM | POA: Diagnosis not present

## 2022-07-12 DIAGNOSIS — R937 Abnormal findings on diagnostic imaging of other parts of musculoskeletal system: Secondary | ICD-10-CM | POA: Diagnosis not present

## 2022-07-12 DIAGNOSIS — M5416 Radiculopathy, lumbar region: Secondary | ICD-10-CM | POA: Diagnosis not present

## 2022-07-12 DIAGNOSIS — M79605 Pain in left leg: Secondary | ICD-10-CM | POA: Insufficient documentation

## 2022-07-12 DIAGNOSIS — M5127 Other intervertebral disc displacement, lumbosacral region: Secondary | ICD-10-CM | POA: Insufficient documentation

## 2022-07-12 DIAGNOSIS — G8929 Other chronic pain: Secondary | ICD-10-CM | POA: Diagnosis present

## 2022-07-12 MED ORDER — IOHEXOL 180 MG/ML  SOLN
10.0000 mL | Freq: Once | INTRAMUSCULAR | Status: AC
  Administered 2022-07-12: 10 mL via EPIDURAL
  Filled 2022-07-12: qty 20

## 2022-07-12 MED ORDER — LIDOCAINE HCL 2 % IJ SOLN
20.0000 mL | Freq: Once | INTRAMUSCULAR | Status: AC
  Administered 2022-07-12: 400 mg
  Filled 2022-07-12: qty 20

## 2022-07-12 MED ORDER — SODIUM CHLORIDE 0.9% FLUSH
2.0000 mL | Freq: Once | INTRAVENOUS | Status: AC
  Administered 2022-07-12: 10 mL

## 2022-07-12 MED ORDER — PENTAFLUOROPROP-TETRAFLUOROETH EX AERO
INHALATION_SPRAY | Freq: Once | CUTANEOUS | Status: AC
  Administered 2022-07-12: 30 via TOPICAL

## 2022-07-12 MED ORDER — SODIUM CHLORIDE (PF) 0.9 % IJ SOLN
INTRAMUSCULAR | Status: AC
  Filled 2022-07-12: qty 10

## 2022-07-12 MED ORDER — MIDAZOLAM HCL 2 MG/2ML IJ SOLN: 0.5000 mg | Freq: Once | INTRAMUSCULAR | Status: DC

## 2022-07-12 MED ORDER — TRIAMCINOLONE ACETONIDE 40 MG/ML IJ SUSP
40.0000 mg | Freq: Once | INTRAMUSCULAR | Status: AC
  Administered 2022-07-12: 40 mg
  Filled 2022-07-12: qty 1

## 2022-07-12 MED ORDER — LACTATED RINGERS IV SOLN: Freq: Once | INTRAVENOUS | Status: DC

## 2022-07-12 MED ORDER — ROPIVACAINE HCL 2 MG/ML IJ SOLN
2.0000 mL | Freq: Once | INTRAMUSCULAR | Status: AC
  Administered 2022-07-12: 20 mL via EPIDURAL
  Filled 2022-07-12: qty 20

## 2022-07-12 NOTE — Progress Notes (Signed)
PROVIDER NOTE: Interpretation of information contained herein should be left to medically-trained personnel. Specific patient instructions are provided elsewhere under "Patient Instructions" section of medical record. This document was created in part using STT-dictation technology, any transcriptional errors that may result from this process are unintentional.  Patient: Lisa Atkins Type: Established DOB: [birth year] MRN: [id number] PCP: Tresea Mall, MD  Service: Procedure DOS: 07/12/2022 Setting: Ambulatory Location: Ambulatory outpatient facility Delivery: Face-to-face Provider: Gaspar Cola, MD Specialty: Interventional Pain Management Specialty designation: 09 Location: Outpatient facility Ref. Prov.: Milinda Pointer, MD       Interventional Therapy   Procedure: Lumbar epidural steroid injection (LESI) (interlaminar) #1    Laterality: Left   Level:  L5-S1 Level.  Imaging: Fluoroscopic guidance         Anesthesia: Local anesthesia (1-2% Lidocaine) Anxiolysis: None                 Sedation: No Sedation                       DOS: 07/12/2022  Performed by: Gaspar Cola, MD  Purpose: Diagnostic/Therapeutic Indications: Lumbar radicular pain of intraspinal etiology of more than 4 weeks that has failed to respond to conservative therapy and is severe enough to impact quality of life or function. 1. Lumbosacral radiculopathy at S1 (Left)   2. L5-S1 lateral recess stenosis (Left)   3. Lumbar radiculopathy (Left)   4. Herniated nucleus pulposus, L5-S1 (Left) (Extrusion)   5. Lumbar nerve root impingement (L5-S1 lateral recess) (S1) (Left)   6. Chronic radicular pain of lower extremity (S1) (Left)   7. Lumbar back pain with radiculopathy affecting left lower extremity   8. 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 Chronic lower extremity pain (2ry area of Pain) (Left)    NAS-11 Pain score:   Pre-procedure: 2 /10   Post-procedure: 0-No  pain/10      Position / Prep / Materials:  Position: Prone w/ head of the table raised (slight reverse trendelenburg) to facilitate breathing.  Prep solution: DuraPrep (Iodine Povacrylex [0.7% available iodine] and Isopropyl Alcohol, 74% w/w) Prep Area: Entire Posterior Lumbar Region from lower scapular tip down to mid buttocks area and from flank to flank. Materials:  Tray: Epidural tray Needle(s):  Type: Epidural needle (Tuohy) Gauge (G):  17 Length: Regular (3.5-in) Qty: 1   Pre-op H&P Assessment:  Lisa Atkins is a 22 y.o. (year old), female patient, seen today for interventional treatment. She  has no past surgical history on file. Lisa Atkins has a current medication list which includes the following prescription(s): flovent hfa. Her primarily concern today is the Back Pain (left)  Initial Vital Signs:  Pulse/HCG Rate: 79ECG Heart Rate: (!) 106 Temp: (!) 79.5 F (26.4 C) Resp: 17 BP: 124/88 SpO2: 100 %  BMI: Estimated body mass index is 19.16 kg/m as calculated from the following:   Height as of this encounter: '5\' 8"'$  (1.727 m).   Weight as of this encounter: 126 lb (57.2 kg).  Risk Assessment: Allergies: Reviewed. 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  Allergy Precautions: None required Coagulopathies: Reviewed. None identified.  Blood-thinner therapy: None at this time Active Infection(s): Reviewed. None identified. Lisa Atkins is afebrile  Site Confirmation: Lisa Atkins was asked to confirm the procedure and laterality before marking the site Procedure checklist: Completed Consent: Before the procedure and under the influence of  no sedative(s), amnesic(s), or anxiolytics, the patient was informed of the treatment options, risks and possible complications. To fulfill our ethical and legal obligations, as recommended by the American Medical Association's Code of Ethics, I have informed the patient of my clinical  impression; the nature and purpose of the treatment or procedure; the risks, benefits, and possible complications of the intervention; the alternatives, including doing nothing; the risk(s) and benefit(s) of the alternative treatment(s) or procedure(s); and the risk(s) and benefit(s) of doing nothing. The patient was provided information about the general risks and possible complications associated with the procedure. These may include, but are not limited to: failure to achieve desired goals, infection, bleeding, organ or nerve damage, allergic reactions, paralysis, and death. In addition, the patient was informed of those risks and complications associated to Spine-related procedures, such as failure to decrease pain; infection (i.e.: Meningitis, epidural or intraspinal abscess); bleeding (i.e.: epidural hematoma, subarachnoid hemorrhage, or any other type of intraspinal or peri-dural bleeding); organ or nerve damage (i.e.: Any type of peripheral nerve, nerve root, or spinal cord injury) with subsequent damage to sensory, motor, and/or autonomic systems, resulting in permanent pain, numbness, and/or weakness of one or several areas of the body; allergic reactions; (i.e.: anaphylactic reaction); and/or death. Furthermore, the patient was informed of those risks and complications associated with the medications. These include, but are not limited to: allergic reactions (i.e.: anaphylactic or anaphylactoid reaction(s)); adrenal axis suppression; blood sugar elevation that in diabetics may result in ketoacidosis or comma; water retention that in patients with history of congestive heart failure may result in shortness of breath, pulmonary edema, and decompensation with resultant heart failure; weight gain; swelling or edema; medication-induced neural toxicity; particulate matter embolism and blood vessel occlusion with resultant organ, and/or nervous system infarction; and/or aseptic necrosis of one or more  joints. Finally, the patient was informed that Medicine is not an exact science; therefore, there is also the possibility of unforeseen or unpredictable risks and/or possible complications that may result in a catastrophic outcome. The patient indicated having understood very clearly. We have given the patient no guarantees and we have made no promises. Enough time was given to the patient to ask questions, all of which were answered to the patient's satisfaction. Ms. Houlihan has indicated that she wanted to continue with the procedure. Attestation: I, the ordering provider, attest that I have discussed with the patient the benefits, risks, side-effects, alternatives, likelihood of achieving goals, and potential problems during recovery for the procedure that I have provided informed consent. Date  Time: 07/12/2022 10:47 AM   Pre-Procedure Preparation:  Monitoring: As per clinic protocol. Respiration, ETCO2, SpO2, BP, heart rate and rhythm monitor placed and checked for adequate function Safety Precautions: Patient was assessed for positional comfort and pressure points before starting the procedure. Time-out: I initiated and conducted the "Time-out" before starting the procedure, as per protocol. The patient was asked to participate by confirming the accuracy of the "Time Out" information. Verification of the correct person, site, and procedure were performed and confirmed by me, the nursing staff, and the patient. "Time-out" conducted as per Joint Commission's Universal Protocol (UP.01.01.01). Time: 1128  Description/Narrative of Procedure:          Target: Epidural space via interlaminar opening, initially targeting the lower laminar border of the superior vertebral body. Region: Lumbar Approach: Percutaneous paravertebral  Rationale (medical necessity): procedure needed and proper for the diagnosis and/or treatment of the patient's medical symptoms and needs. Procedural Technique Safety  Precautions:  Aspiration looking for blood return was conducted prior to all injections. At no point did we inject any substances, as a needle was being advanced. No attempts were made at seeking any paresthesias. Safe injection practices and needle disposal techniques used. Medications properly checked for expiration dates. SDV (single dose vial) medications used. Description of the Procedure: Protocol guidelines were followed. The procedure needle was introduced through the skin, ipsilateral to the reported pain, and advanced to the target area. Bone was contacted and the needle walked caudad, until the lamina was cleared. The epidural space was identified using "loss-of-resistance technique" with 2-3 ml of PF-NaCl (0.9% NSS), in a 5cc LOR glass syringe.  Vitals:   07/12/22 1044 07/12/22 1130 07/12/22 1136  BP: 124/88 129/85 129/83  Pulse: 79    Resp:  17 19  Temp: (!) 79.5 F (26.4 C)    SpO2: 100% 100% 100%  Weight: 126 lb (57.2 kg)    Height: '5\' 8"'$  (1.727 m)      Start Time: 1128 hrs. End Time: 1135 hrs.  Imaging Guidance (Spinal):          Type of Imaging Technique: Fluoroscopy Guidance (Spinal) Indication(s): Assistance in needle guidance and placement for procedures requiring needle placement in or near specific anatomical locations not easily accessible without such assistance. Exposure Time: Please see nurses notes. Contrast: Before injecting any contrast, we confirmed that the patient did not have an allergy to iodine, shellfish, or radiological contrast. Once satisfactory needle placement was completed at the desired level, radiological contrast was injected. Contrast injected under live fluoroscopy. No contrast complications. See chart for type and volume of contrast used. Fluoroscopic Guidance: I was personally present during the use of fluoroscopy. "Tunnel Vision Technique" used to obtain the best possible view of the target area. Parallax error corrected before commencing the  procedure. "Direction-depth-direction" technique used to introduce the needle under continuous pulsed fluoroscopy. Once target was reached, antero-posterior, oblique, and lateral fluoroscopic projection used confirm needle placement in all planes. Images permanently stored in EMR. Interpretation: I personally interpreted the imaging intraoperatively. Adequate needle placement confirmed in multiple planes. Appropriate spread of contrast into desired area was observed. No evidence of afferent or efferent intravascular uptake. No intrathecal or subarachnoid spread observed. Permanent images saved into the patient's record.  Antibiotic Prophylaxis:   Anti-infectives (From admission, onward)    None      Indication(s): None identified  Post-operative Assessment:  Post-procedure Vital Signs:  Pulse/HCG Rate: 7996 Temp: (!) 79.5 F (26.4 C) Resp: 19 BP: 129/83 SpO2: 100 %  EBL: None  Complications: No immediate post-treatment complications observed by team, or reported by patient.  Note: The patient tolerated the entire procedure well. A repeat set of vitals were taken after the procedure and the patient was kept under observation following institutional policy, for this type of procedure. Post-procedural neurological assessment was performed, showing return to baseline, prior to discharge. The patient was provided with post-procedure discharge instructions, including a section on how to identify potential problems. Should any problems arise concerning this procedure, the patient was given instructions to immediately contact us, at any time, without hesitation. In any case, we plan to contact the patient by telephone for a follow-up status report regarding this interventional procedure.  Comments:  No additional relevant information.  Plan of Care (POC)  Orders:  Orders Placed This Encounter  Procedures   Lumbar Epidural Injection    Scheduling Instructions:     Procedure: Interlaminar  LESI L5-S1  Laterality: Left     Sedation: Patient's choice     Timeframe: Today    Order Specific Question:   Where will this procedure be performed?    Answer:   [facility]   [facility] C-ARM 1-60 MIN NO REPORT    Intraoperative interpretation by procedural physician at [place].    Standing Status:   Standing    Number of Occurrences:   1    Order Specific Question:   Reason for exam:    Answer:   Assistance in needle guidance and placement for procedures requiring needle placement in or near specific anatomical locations not easily accessible without such assistance.   Informed Consent Details: Physician/Practitioner Attestation; Transcribe to consent form and obtain patient signature    Note: Always confirm laterality of pain with Ms. Quentin Ore, before procedure. Transcribe to consent form and obtain patient signature.    Order Specific Question:   Physician/Practitioner attestation of informed consent for procedure/surgical case    Answer:   I, the physician/practitioner, attest that I have discussed with the patient the benefits, risks, side effects, alternatives, likelihood of achieving goals and potential problems during recovery for the procedure that I have provided informed consent.    Order Specific Question:   Procedure    Answer:   Lumbar epidural steroid injection under fluoroscopic guidance    Order Specific Question:   Physician/Practitioner performing the procedure    Answer:   Kleber Crean A. Dossie Arbour, MD    Order Specific Question:   Indication/Reason    Answer:   Low back and/or lower extremity pain secondary to lumbar radiculitis   Provide equipment / supplies at bedside    Procedural tray: Epidural Tray (Disposable  single use) Skin infiltration needle: Regular 1.5-in, 25-G, (x1) Block needle size: Regular standard Catheter: No catheter required    Standing Status:   Standing    Number of Occurrences:   1    Order Specific Question:    Specify    Answer:   Epidural Tray   Chronic Opioid Analgesic:  None MME/day: 0 mg/day   Medications ordered for procedure: Meds ordered this encounter  Medications   iohexol (OMNIPAQUE) 180 MG/ML injection 10 mL    Must be Myelogram-compatible. If not available, you may substitute with a water-soluble, non-ionic, hypoallergenic, myelogram-compatible radiological contrast medium.   lidocaine (XYLOCAINE) 2 % (with pres) injection 400 mg   pentafluoroprop-tetrafluoroeth (GEBAUERS) aerosol   DISCONTD: lactated ringers infusion   DISCONTD: midazolam (VERSED) injection 0.5-2 mg    Make sure Flumazenil is available in the pyxis when using this medication. If oversedation occurs, administer 0.2 mg IV over 15 sec. If after 45 sec no response, administer 0.2 mg again over 1 min; may repeat at 1 min intervals; not to exceed 4 doses (1 mg)   sodium chloride flush (NS) 0.9 % injection 2 mL   ropivacaine (PF) 2 mg/mL (0.2%) (NAROPIN) injection 2 mL   triamcinolone acetonide (KENALOG-40) injection 40 mg   Medications administered: We administered iohexol, lidocaine, pentafluoroprop-tetrafluoroeth, sodium chloride flush, ropivacaine (PF) 2 mg/mL (0.2%), and triamcinolone acetonide.  See the medical record for exact dosing, route, and time of administration.  Follow-up plan:   Return in about 2 weeks (around 07/26/2022) for Proc-day (T,Th), (Face2F), (PPE).       Interventional Therapies  Risk Factors  Considerations:     Planned  Pending:   9-day prednisone taper   Under consideration:   Diagnostic/therapeutic left L5-S1 LESI #1  Completed:   None at this time   Completed by other providers:   None at this time   Therapeutic  Palliative (PRN) options:   None established       Recent Visits Date Type Provider Dept  06/11/22 Office Visit Milinda Pointer, MD [facility]  Showing recent visits within past 90 days and meeting all other requirements Today's  Visits Date Type Provider Dept  07/12/22 Procedure visit Milinda Pointer, MD [facility]  Showing today's visits and meeting all other requirements Future Appointments Date Type Provider Dept  07/31/22 Appointment Milinda Pointer, MD [facility]  Showing future appointments within next 90 days and meeting all other requirements  Disposition: Discharge home  Discharge (Date  Time): 07/12/2022; 1145 hrs.   Primary Care Physician: Tresea Mall, MD Location: [facility] Note by: Gaspar Cola, MD (TTS technology used. I apologize for any typographical errors that were not detected and corrected.) Date: 07/12/2022; Time: 12:40 PM  Disclaimer:  Medicine is not an Chief Strategy Officer. The only guarantee in medicine is that nothing is guaranteed. It is important to note that the decision to proceed with this intervention was based on the information collected from the patient. The Data and conclusions were drawn from the patient's questionnaire, the interview, and the physical examination. Because the information was provided in large part by the patient, it cannot be guaranteed that it has not been purposely or unconsciously manipulated. Every effort has been made to obtain as much relevant data as possible for this evaluation. It is important to note that the conclusions that lead to this procedure are derived in large part from the available data. Always take into account that the treatment will also be dependent on availability of resources and existing treatment guidelines, considered by other Pain Management Practitioners as being common knowledge and practice, at the time of the intervention. For Medico-Legal purposes, it is also important to point out that variation in procedural techniques and pharmacological choices are the acceptable norm. The indications, contraindications, technique, and results of the above procedure should only be  interpreted and judged by a Board-Certified Interventional Pain Specialist with extensive familiarity and expertise in the same exact procedure and technique.

## 2022-07-12 NOTE — Progress Notes (Signed)
Safety precautions to be maintained throughout the outpatient stay will include: orient to surroundings, keep bed in low position, maintain call bell within reach at all times, provide assistance with transfer out of bed and ambulation.  

## 2022-07-12 NOTE — Patient Instructions (Signed)

## 2022-07-13 ENCOUNTER — Telehealth: Payer: Self-pay

## 2022-07-13 NOTE — Telephone Encounter (Signed)
Called PP. No answer, left message to call if needed. 

## 2022-07-29 NOTE — Progress Notes (Unsigned)
PROVIDER NOTE: Information contained herein reflects review and annotations entered in association with encounter. Interpretation of such information and data should be left to medically-trained personnel. Information provided to patient can be located elsewhere in the medical record under "Patient Instructions". Document created using STT-dictation technology, any transcriptional errors that may result from process are unintentional.    Patient: Lisa Atkins  Service Category: E/M  Provider: Gaspar Cola, MD  DOB: [birth year]  DOS: 07/31/2022  Referring Provider: Tresea Mall, MD  MRN: [id number]  Specialty: Interventional Pain Management  PCP: Tresea Mall, MD  Type: Established Patient  Setting: Ambulatory outpatient    Location: Office  Delivery: Face-to-face     HPI  [name], a 22 y.o. year old female, is here today because of her No primary diagnosis found.. Lisa Atkins's primary complain today is No chief complaint on file.  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 Pain Assessment: Severity of   is reported as a  /10. Location:    / . Onset:  . Quality:  . Timing:  . Modifying factor(s):  Marland Kitchen Vitals:  vitals were not taken for this visit.  BMI: Estimated body mass index is 19.16 kg/m as calculated from the following:   Height as of 07/12/22: 5\' 8"  (1.727 m).   Weight as of 07/12/22: 126 lb (57.2 kg). Last encounter:  06/11/2022. Last procedure: 07/12/2022.  Reason for encounter:  *** . ***  Pharmacotherapy Assessment  Analgesic: None MME/day: 0 mg/day   Monitoring: [place] PMP: PDMP not reviewed this encounter.       Pharmacotherapy: No side-effects or adverse reactions reported. Compliance: No problems identified. Effectiveness: Clinically acceptable.  No notes on file  No results found for: "CBDTHCR" No results found for: "D8THCCBX" No results found for: "D9THCCBX"  UDS:  No results found for: "SUMMARY"    ROS  Constitutional: Denies any fever or chills Gastrointestinal: No reported hemesis, hematochezia, vomiting, or acute GI distress Musculoskeletal: Denies any acute onset joint swelling, redness, loss of ROM, or weakness Neurological: No reported episodes of acute onset apraxia, aphasia, dysarthria, agnosia, amnesia, paralysis, loss of coordination, or loss of consciousness  Medication Review  fluticasone  History Review  Allergy: Lisa Atkins is 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 Drug: Lisa Atkins  reports that she does not currently use drugs. Alcohol:  reports no history of alcohol use. Tobacco:  reports that she has never smoked. She has never used smokeless tobacco. Social: Lisa Atkins  reports that she has never smoked. She has never used smokeless tobacco. She reports that she does not currently use drugs. She reports that she does not drink alcohol. Medical:  has a past medical history of Allergy, Anxiety, Asthma, and Pharmacologic therapy (06/10/2022). Surgical: Lisa Atkins  has no past surgical history on file. Family: family history is not on file.  Laboratory Chemistry Profile   Renal No results found for: "BUN", "CREATININE", "LABCREA", "BCR", "GFR", "GFRAA", "GFRNONAA", "LABVMA", "EPIRU", "[id number]", "NOREPRU", "NOREPI24HUR", "DOPARU", "DOPAM24HRUR"  Hepatic  No results found for: "AST", "ALT", "ALBUMIN", "ALKPHOS",  "HCVAB", "AMYLASE", "LIPASE", "AMMONIA"  Electrolytes No results found for: "NA", "K", "CL", "CALCIUM", "MG", "PHOS"  Bone No results found for: "VD25OH", "VD125OH2TOT", "[id number]", "[id number]", "25OHVITD1", "25OHVITD2", "25OHVITD3", "TESTOFREE", "TESTOSTERONE"  Inflammation (CRP: Acute Phase) (ESR: Chronic Phase) No results found for: "CRP", "ESRSEDRATE", "LATICACIDVEN"       Note: Above Lab results reviewed.  Recent Imaging Review  DG PAIN CLINIC C-ARM 1-60 MIN NO REPORT Fluoro was used, but no Radiologist interpretation will be provided.  Please refer to "NOTES" tab for provider progress note. Note: Reviewed        Physical Exam  General appearance: Well nourished, well developed, and well hydrated. In no apparent acute distress Mental status: Alert, oriented x 3 (person, place, & time)       Respiratory: No evidence of acute respiratory distress Eyes: PERLA Vitals: There were no vitals taken for this visit. BMI: Estimated body mass index is 19.16 kg/m as calculated from the following:   Height as of 07/12/22: 5\' 8"  (1.727 m).   Weight as of 07/12/22: 126 lb (57.2 kg). Ideal: Patient weight not recorded  Assessment   Diagnosis Status  No diagnosis found. Controlled Controlled Controlled   Updated Problems: No problems updated.  Plan of Care  Problem-specific:  No problem-specific Assessment & Plan notes found for this encounter.  Lisa Atkins has a current medication list which includes the following long-term medication(s): flovent hfa.  Pharmacotherapy (Medications Ordered): No orders of the defined types were placed in this encounter.  Orders:  No orders of the defined types were placed in this encounter.  Follow-up plan:   No follow-ups on file.      Interventional Therapies  Risk Factors  Considerations:     Planned  Pending:   9-day prednisone taper   Under consideration:   Diagnostic/therapeutic left L5-S1 LESI #1    Completed:   None  at this time   Completed by other providers:   None at this time   Therapeutic  Palliative (PRN) options:   None established        Recent Visits Date Type Provider Dept  07/12/22 Procedure visit Milinda Pointer, [facility]  06/11/22 Office Visit Milinda Pointer, MD [facility]  Showing recent visits within past 90 days and meeting all other requirements Future Appointments Date Type Provider Dept  07/31/22 Appointment Milinda Pointer, MD [facility]  Showing future appointments within next 90 days and meeting all other requirements  I discussed the assessment and treatment plan with the patient. The patient was provided an opportunity to ask questions and all were answered. The patient agreed with the plan and demonstrated an understanding of the instructions.  Patient advised to call back or seek an in-person evaluation if the symptoms or condition worsens.  Duration of encounter: *** minutes.  Total time on encounter, as per AMA guidelines included both the face-to-face and non-face-to-face time personally spent by the physician and/or other qualified health care professional(s) on the day of the encounter (includes time in activities that require the physician or other qualified health care professional and does not include time in activities normally performed by clinical staff). Physician's time may include the following activities when performed: Preparing to see the patient (e.g., pre-charting review of records, searching for previously ordered imaging, lab work, and nerve conduction tests) Review of prior analgesic pharmacotherapies. Reviewing PMP Interpreting ordered tests (e.g., lab work, imaging, nerve conduction tests) Performing  post-procedure evaluations, including interpretation of diagnostic procedures Obtaining and/or reviewing separately obtained history Performing a medically appropriate examination and/or  evaluation Counseling and educating the patient/family/caregiver Ordering medications, tests, or procedures Referring and communicating with other health care professionals (when not separately reported) Documenting clinical information in the electronic or other health record Independently interpreting results (not separately reported) and communicating results to the patient/ family/caregiver Care coordination (not separately reported)  Note by: Gaspar Cola, MD Date: 07/31/2022; Time: 6:43 PM

## 2022-07-31 ENCOUNTER — Encounter: Payer: Self-pay | Admitting: Pain Medicine

## 2022-07-31 ENCOUNTER — Ambulatory Visit: Payer: 59 | Attending: Pain Medicine | Admitting: Pain Medicine

## 2022-07-31 VITALS — BP 127/86 | HR 68 | Temp 97.9°F | Resp 16

## 2022-07-31 DIAGNOSIS — M79605 Pain in left leg: Secondary | ICD-10-CM

## 2022-07-31 DIAGNOSIS — G8929 Other chronic pain: Secondary | ICD-10-CM | POA: Diagnosis present

## 2022-07-31 DIAGNOSIS — M5417 Radiculopathy, lumbosacral region: Secondary | ICD-10-CM | POA: Insufficient documentation

## 2022-07-31 DIAGNOSIS — M5416 Radiculopathy, lumbar region: Secondary | ICD-10-CM | POA: Diagnosis present

## 2022-07-31 DIAGNOSIS — M5410 Radiculopathy, site unspecified: Secondary | ICD-10-CM

## 2022-07-31 DIAGNOSIS — M5442 Lumbago with sciatica, left side: Secondary | ICD-10-CM | POA: Diagnosis not present

## 2022-07-31 NOTE — Patient Instructions (Signed)
______________________________________________________________________  Procedure instructions  Do not eat or drink fluids (other than water) for 6 hours before your procedure  No water for 2 hours before your procedure  Take your blood pressure medicine with a sip of water  Arrive 30 minutes before your appointment  Carefully read the "Preparing for your procedure" detailed instructions  If you have questions call us at [phone]  _____________________________________________________________________    ______________________________________________________________________  Preparing for your procedure  Appointments: If you think you may not be able to keep your appointment, call 24-48 hours in advance to cancel. We need time to make it available to others.  During your procedure appointment there will be: No Prescription Refills. No disability issues to discussed. No medication changes or discussions.  Instructions: Food intake: Avoid eating anything solid for at least 8 hours prior to your procedure. Clear liquid intake: You may take clear liquids such as water up to 2 hours prior to your procedure. (No carbonated drinks. No soda.) Transportation: Unless otherwise stated by your physician, bring a driver. Morning Medicines: Except for blood thinners, take all of your other morning medications with a sip of water. Make sure to take your heart and blood pressure medicines. If your blood pressure's lower number is above 100, the case will be rescheduled. Blood thinners: Make sure to stop your blood thinners as instructed.  If you take a blood thinner, but were not instructed to stop it, call our office [phone] and ask to talk to a nurse. Not stopping a blood thinner prior to certain procedures could lead to serious complications. Diabetics on insulin: Notify the staff so that you can be scheduled 1st case in the morning. If your diabetes requires high dose insulin,  take only  of your normal insulin dose the morning of the procedure and notify the staff that you have done so. Preventing infections: Shower with an antibacterial soap the morning of your procedure.  Build-up your immune system: Take 1000 mg of Vitamin C with every meal (3 times a day) the day prior to your procedure. Antibiotics: Inform the nursing staff if you are taking any antibiotics or if you have any conditions that may require antibiotics prior to procedures. (Example: recent joint implants)   Pregnancy: If you are pregnant make sure to notify the nursing staff. Not doing so may result in injury to the fetus, including death.  Sickness: If you have a cold, fever, or any active infections, call and cancel or reschedule your procedure. Receiving steroids while having an infection may result in complications. Arrival: You must be in the facility at least 30 minutes prior to your scheduled procedure. Tardiness: Your scheduled time is also the cutoff time. If you do not arrive at least 15 minutes prior to your procedure, you will be rescheduled.  Children: Do not bring any children with you. Make arrangements to keep them home. Dress appropriately: There is always a possibility that your clothing may get soiled. Avoid long dresses. Valuables: Do not bring any jewelry or valuables.  Reasons to call and reschedule or cancel your procedure: (Following these recommendations will minimize the risk of a serious complication.) Surgeries: Avoid having procedures within 2 weeks of any surgery. (Avoid for 2 weeks before or after any surgery). Flu Shots: Avoid having procedures within 2 weeks of a flu shots or . (Avoid for 2 weeks before or after immunizations). Barium: Avoid having a procedure within 7-10 days after having had a radiological study involving the use of  radiological contrast. (Myelograms, Barium swallow or enema study). Heart attacks: Avoid any elective procedures or surgeries for the  initial 6 months after a "Myocardial Infarction" (Heart Attack). Blood thinners: It is imperative that you stop these medications before procedures. Let us know if you if you take any blood thinner.  Infection: Avoid procedures during or within two weeks of an infection (including chest colds or gastrointestinal problems). Symptoms associated with infections include: Localized redness, fever, chills, night sweats or profuse sweating, burning sensation when voiding, cough, congestion, stuffiness, runny nose, sore throat, diarrhea, nausea, vomiting, cold or Flu symptoms, recent or current infections. It is specially important if the infection is over the area that we intend to treat. Heart and lung problems: Symptoms that may suggest an active cardiopulmonary problem include: cough, chest pain, breathing difficulties or shortness of breath, dizziness, ankle swelling, uncontrolled high or unusually low blood pressure, and/or palpitations. If you are experiencing any of these symptoms, cancel your procedure and contact your primary care physician for an evaluation.  Remember:  Regular Business hours are:  Monday to Thursday 8:00 AM to 4:00 PM  Provider's Schedule: Milinda Pointer, MD:  Procedure days: Tuesday and Thursday 7:30 AM to 4:00 PM  Gillis Santa, MD:  Procedure days: Monday and Wednesday 7:30 AM to 4:00 PM  ______________________________________________________________________    ____________________________________________________________________________________________  General Risks and Possible Complications  Patient Responsibilities: It is important that you read this as it is part of your informed consent. It is our duty to inform you of the risks and possible complications associated with treatments offered to you. It is your responsibility as a patient to read this and to ask questions about anything that is not clear or that you believe was not covered in this  document.  Patient's Rights: You have the right to refuse treatment. You also have the right to change your mind, even after initially having agreed to have the treatment done. However, under this last option, if you wait until the last second to change your mind, you may be charged for the materials used up to that point.  Introduction: Medicine is not an Chief Strategy Officer. Everything in Medicine, including the lack of treatment(s), carries the potential for danger, harm, or loss (which is by definition: Risk). In Medicine, a complication is a secondary problem, condition, or disease that can aggravate an already existing one. All treatments carry the risk of possible complications. The fact that a side effects or complications occurs, does not imply that the treatment was conducted incorrectly. It must be clearly understood that these can happen even when everything is done following the highest safety standards.  No treatment: You can choose not to proceed with the proposed treatment alternative. The "PRO(s)" would include: avoiding the risk of complications associated with the therapy. The "CON(s)" would include: not getting any of the treatment benefits. These benefits fall under one of three categories: diagnostic; therapeutic; and/or palliative. Diagnostic benefits include: getting information which can ultimately lead to improvement of the disease or symptom(s). Therapeutic benefits are those associated with the successful treatment of the disease. Finally, palliative benefits are those related to the decrease of the primary symptoms, without necessarily curing the condition (example: decreasing the pain from a flare-up of a chronic condition, such as incurable terminal cancer).  General Risks and Complications: These are associated to most interventional treatments. They can occur alone, or in combination. They fall under one of the following six (6) categories: no benefit or worsening of symptoms;  bleeding; infection; nerve damage; allergic reactions; and/or death. No benefits or worsening of symptoms: In Medicine there are no guarantees, only probabilities. No healthcare provider can ever guarantee that a medical treatment will work, they can only state the probability that it may. Furthermore, there is always the possibility that the condition may worsen, either directly, or indirectly, as a consequence of the treatment. Bleeding: This is more common if the patient is taking a blood thinner, either prescription or over the counter (example: Goody Powders, Fish oil, Aspirin, Garlic, etc.), or if suffering a condition associated with impaired coagulation (example: Hemophilia, cirrhosis of the liver, low platelet counts, etc.). However, even if you do not have one on these, it can still happen. If you have any of these conditions, or take one of these drugs, make sure to notify your treating physician. Infection: This is more common in patients with a compromised immune system, either due to disease (example: diabetes, cancer, human immunodeficiency virus [HIV], etc.), or due to medications or treatments (example: therapies used to treat cancer and rheumatological diseases). However, even if you do not have one on these, it can still happen. If you have any of these conditions, or take one of these drugs, make sure to notify your treating physician. Nerve Damage: This is more common when the treatment is an invasive one, but it can also happen with the use of medications, such as those used in the treatment of cancer. The damage can occur to small secondary nerves, or to large primary ones, such as those in the spinal cord and brain. This damage may be temporary or permanent and it may lead to impairments that can range from temporary numbness to permanent paralysis and/or brain death. Allergic Reactions: Any time a substance or material comes in contact with our body, there is the possibility of an  allergic reaction. These can range from a mild skin rash (contact dermatitis) to a severe systemic reaction (anaphylactic reaction), which can result in death. Death: In general, any medical intervention can result in death, most of the time due to an unforeseen complication. ____________________________________________________________________________________________    ____________________________________________________________________________________________  Pain Prevention Technique  Definition:   A technique used to minimize the effects of an activity known to cause inflammation or swelling, which in turn leads to an increase in pain.  Purpose: To prevent swelling from occurring. It is based on the fact that it is easier to prevent swelling from happening than it is to get rid of it, once it occurs.  Contraindications: Anyone with allergy or hypersensitivity to the recommended medications. Anyone taking anticoagulants (Blood Thinners) (e.g., Coumadin, Warfarin, Plavix, etc.). Patients in Renal Failure.  Technique: Before you undertake an activity known to cause pain, or a flare-up of your chronic pain, and before you experience any pain, do the following:  On a full stomach, take 4 (four) over the counter Ibuprofens 200mg  tablets (Motrin), for a total of 800 mg. In addition, take over the counter Magnesium 400 to 500 mg, before doing the activity.  Six (6) hours later, again on a full stomach, repeat the Ibuprofen. That night, take a warm shower and stretch under the running warm water.  This technique may be sufficient to abort the pain and discomfort before it happens. Keep in mind that it takes a lot less medication to prevent swelling than it takes to eliminate it once it occurs.  ____________________________________________________________________________________________

## 2022-07-31 NOTE — Progress Notes (Unsigned)
Safety precautions to be maintained throughout the outpatient stay will include: orient to surroundings, keep bed in low position, maintain call bell within reach at all times, provide assistance with transfer out of bed and ambulation.  

## 2022-08-21 ENCOUNTER — Ambulatory Visit: Payer: 59 | Admitting: Pain Medicine
